# Patient Record
Sex: Male | Born: 1980 | Hispanic: Yes | Marital: Single | Smoking: Never smoker
Health system: Southern US, Community
[De-identification: ages and names within clinical notes are randomized; demographics above are authoritative.]

---

## 2013-10-04 ENCOUNTER — Emergency Department (HOSPITAL_COMMUNITY): Payer: Self-pay

## 2013-10-04 ENCOUNTER — Inpatient Hospital Stay (HOSPITAL_COMMUNITY)
Admission: EM | Admit: 2013-10-04 | Discharge: 2013-10-05 | DRG: 090 | Disposition: A | Discharge status: Home / Self Care | Payer: Self-pay | Attending: General Surgery | Admitting: General Surgery

## 2013-10-04 ENCOUNTER — Other Ambulatory Visit: Payer: Self-pay

## 2013-10-04 ENCOUNTER — Encounter (HOSPITAL_COMMUNITY): Payer: Self-pay | Admitting: Emergency Medicine

## 2013-10-04 DIAGNOSIS — S1093XA Contusion of unspecified part of neck, initial encounter: Secondary | ICD-10-CM

## 2013-10-04 DIAGNOSIS — H532 Diplopia: Secondary | ICD-10-CM | POA: Diagnosis present

## 2013-10-04 DIAGNOSIS — S060XAA Concussion with loss of consciousness status unknown, initial encounter: Principal | ICD-10-CM | POA: Diagnosis present

## 2013-10-04 DIAGNOSIS — S0083XA Contusion of other part of head, initial encounter: Secondary | ICD-10-CM | POA: Diagnosis present

## 2013-10-04 DIAGNOSIS — S0003XA Contusion of scalp, initial encounter: Secondary | ICD-10-CM

## 2013-10-04 DIAGNOSIS — S0180XA Unspecified open wound of other part of head, initial encounter: Secondary | ICD-10-CM

## 2013-10-04 DIAGNOSIS — S0990XA Unspecified injury of head, initial encounter: Secondary | ICD-10-CM

## 2013-10-04 DIAGNOSIS — S060X9A Concussion with loss of consciousness of unspecified duration, initial encounter: Principal | ICD-10-CM | POA: Diagnosis present

## 2013-10-04 DIAGNOSIS — S0181XA Laceration without foreign body of other part of head, initial encounter: Secondary | ICD-10-CM | POA: Diagnosis present

## 2013-10-04 LAB — URINALYSIS, ROUTINE W REFLEX MICROSCOPIC
BILIRUBIN URINE: NEGATIVE
GLUCOSE, UA: NEGATIVE mg/dL
KETONES UR: NEGATIVE mg/dL
Leukocytes, UA: NEGATIVE
Nitrite: NEGATIVE
Protein, ur: 100 mg/dL — AB
SPECIFIC GRAVITY, URINE: 1.027 (ref 1.005–1.030)
Urobilinogen, UA: 0.2 mg/dL (ref 0.0–1.0)
pH: 6.5 (ref 5.0–8.0)

## 2013-10-04 LAB — CBC WITH DIFFERENTIAL/PLATELET
BASOS PCT: 0 % (ref 0–1)
Basophils Absolute: 0 10*3/uL (ref 0.0–0.1)
Eosinophils Absolute: 0.2 10*3/uL (ref 0.0–0.7)
Eosinophils Relative: 2 % (ref 0–5)
HCT: 44.3 % (ref 39.0–52.0)
HEMOGLOBIN: 16.4 g/dL (ref 13.0–17.0)
Lymphocytes Relative: 26 % (ref 12–46)
Lymphs Abs: 2.8 10*3/uL (ref 0.7–4.0)
MCH: 31.3 pg (ref 26.0–34.0)
MCHC: 37 g/dL — ABNORMAL HIGH (ref 30.0–36.0)
MCV: 84.5 fL (ref 78.0–100.0)
MONOS PCT: 9 % (ref 3–12)
Monocytes Absolute: 0.9 10*3/uL (ref 0.1–1.0)
NEUTROS ABS: 7 10*3/uL (ref 1.7–7.7)
Neutrophils Relative %: 64 % (ref 43–77)
PLATELETS: 279 10*3/uL (ref 150–400)
RBC: 5.24 MIL/uL (ref 4.22–5.81)
RDW: 12.1 % (ref 11.5–15.5)
WBC: 11 10*3/uL — ABNORMAL HIGH (ref 4.0–10.5)

## 2013-10-04 LAB — POCT I-STAT TROPONIN I: Troponin i, poc: 0.01 ng/mL (ref 0.00–0.08)

## 2013-10-04 LAB — COMPREHENSIVE METABOLIC PANEL
ALK PHOS: 63 U/L (ref 39–117)
ALT: 49 U/L (ref 0–53)
AST: 56 U/L — AB (ref 0–37)
Albumin: 4.3 g/dL (ref 3.5–5.2)
BUN: 14 mg/dL (ref 6–23)
CHLORIDE: 100 meq/L (ref 96–112)
CO2: 25 mEq/L (ref 19–32)
Calcium: 9.3 mg/dL (ref 8.4–10.5)
Creatinine, Ser: 1.01 mg/dL (ref 0.50–1.35)
Glucose, Bld: 106 mg/dL — ABNORMAL HIGH (ref 70–99)
POTASSIUM: 3.7 meq/L (ref 3.7–5.3)
SODIUM: 138 meq/L (ref 137–147)
TOTAL PROTEIN: 7.5 g/dL (ref 6.0–8.3)
Total Bilirubin: 0.3 mg/dL (ref 0.3–1.2)

## 2013-10-04 LAB — URINE MICROSCOPIC-ADD ON

## 2013-10-04 LAB — RAPID URINE DRUG SCREEN, HOSP PERFORMED
AMPHETAMINES: NOT DETECTED
BARBITURATES: NOT DETECTED
Benzodiazepines: NOT DETECTED
Cocaine: NOT DETECTED
Opiates: NOT DETECTED
Tetrahydrocannabinol: NOT DETECTED

## 2013-10-04 LAB — LIPASE, BLOOD: Lipase: 33 U/L (ref 11–59)

## 2013-10-04 LAB — ETHANOL

## 2013-10-04 MED ORDER — TETANUS-DIPHTH-ACELL PERTUSSIS 5-2.5-18.5 LF-MCG/0.5 IM SUSP
0.5000 mL | Freq: Once | INTRAMUSCULAR | Status: AC
  Administered 2013-10-04: 0.5 mL via INTRAMUSCULAR
  Filled 2013-10-04: qty 0.5

## 2013-10-04 MED ORDER — ACETAMINOPHEN 650 MG RE SUPP
650.0000 mg | Freq: Once | RECTAL | Status: AC
  Administered 2013-10-04: 650 mg via RECTAL
  Filled 2013-10-04: qty 1

## 2013-10-04 NOTE — Progress Notes (Signed)
Chaplain reported to page.  Pt responding to medical team but does not speak english or have id on him.  Chaplain asked emt if family was present and they said "no".  Chaplain left message with front desk to page if follow up is needed.
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
## 2013-10-04 NOTE — ED Notes (Signed)
Dr. Janee Mornhompson in to assess pt for admission
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
## 2013-10-04 NOTE — ED Notes (Signed)
Introduced self to pt's cousin, Wallie Renshawlmer Martinez [phone]) and Stormy FabianKathryn Rowdowsky [phone]), Elmer's employer( [employer]), and explained role of CSW and offered emotional support.  Pt disoriented. 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 Ms. Philis KendallRowdowsky agreeable to be contact person for pt as his cousin speaks very little english. Cousin Jay Schlichterlmer is his only relative in the area. Pt lives with cousin in a house Ms. Rodowsky pays for. CSW will continue to follow for disposition.

## 2013-10-04 NOTE — ED Notes (Signed)
Pt resting quietly at this time with eyes closed.  Will respond to verbal stimuli then goes back to sleep.
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
## 2013-10-05 ENCOUNTER — Inpatient Hospital Stay (HOSPITAL_COMMUNITY): Payer: Self-pay

## 2013-10-05 DIAGNOSIS — S0181XA Laceration without foreign body of other part of head, initial encounter: Secondary | ICD-10-CM | POA: Diagnosis present

## 2013-10-05 DIAGNOSIS — S0083XA Contusion of other part of head, initial encounter: Secondary | ICD-10-CM | POA: Diagnosis present

## 2013-10-05 LAB — INFLUENZA PANEL BY PCR (TYPE A & B)
H1N1FLUPCR: NOT DETECTED
Influenza A By PCR: NEGATIVE
Influenza B By PCR: NEGATIVE

## 2013-10-05 LAB — BASIC METABOLIC PANEL
BUN: 13 mg/dL (ref 6–23)
CALCIUM: 9.1 mg/dL (ref 8.4–10.5)
CHLORIDE: 101 meq/L (ref 96–112)
CO2: 24 meq/L (ref 19–32)
Creatinine, Ser: 1.01 mg/dL (ref 0.50–1.35)
GFR calc Af Amer: >90 mL/min (ref >90)
GFR calc non Af Amer: >90 mL/min (ref >90)
Glucose, Bld: 106 mg/dL — ABNORMAL HIGH (ref 70–99)
Potassium: 4.1 mEq/L (ref 3.7–5.3)
Sodium: 140 mEq/L (ref 137–147)

## 2013-10-05 LAB — CBC
HEMATOCRIT: 43.5 % (ref 39.0–52.0)
Hemoglobin: 15.6 g/dL (ref 13.0–17.0)
MCH: 30.6 pg (ref 26.0–34.0)
MCHC: 35.9 g/dL (ref 30.0–36.0)
MCV: 85.5 fL (ref 78.0–100.0)
PLATELETS: 285 10*3/uL (ref 150–400)
RBC: 5.09 MIL/uL (ref 4.22–5.81)
RDW: 12.1 % (ref 11.5–15.5)
WBC: 11 10*3/uL — AB (ref 4.0–10.5)

## 2013-10-05 MED ORDER — ONDANSETRON HCL 4 MG PO TABS
4.0000 mg | ORAL_TABLET | Freq: Four times a day (QID) | ORAL | Status: DC | PRN
Start: 2013-10-05 — End: 2013-10-05

## 2013-10-05 MED ORDER — ONDANSETRON HCL 4 MG/2ML IJ SOLN: 4.0000 mg | Freq: Four times a day (QID) | INTRAMUSCULAR | Status: DC | PRN

## 2013-10-05 MED ORDER — MORPHINE SULFATE 2 MG/ML IJ SOLN: 2.0000 mg | INTRAMUSCULAR | Status: DC | PRN

## 2013-10-05 MED ORDER — OXYCODONE HCL 5 MG PO TABS: 10.0000 mg | ORAL_TABLET | ORAL | Status: DC | PRN

## 2013-10-05 MED ORDER — PANTOPRAZOLE SODIUM 40 MG IV SOLR
40.0000 mg | Freq: Every day | INTRAVENOUS | Status: DC
  Filled 2013-10-05: qty 40

## 2013-10-05 MED ORDER — KCL IN DEXTROSE-NACL 20-5-0.45 MEQ/L-%-% IV SOLN
INTRAVENOUS | Status: DC
  Administered 2013-10-05: 04:00:00 via INTRAVENOUS
  Filled 2013-10-05 (×2): qty 1000

## 2013-10-05 MED ORDER — PANTOPRAZOLE SODIUM 40 MG PO TBEC
40.0000 mg | DELAYED_RELEASE_TABLET | Freq: Every day | ORAL | Status: DC
  Administered 2013-10-05: 40 mg via ORAL
  Filled 2013-10-05: qty 1

## 2013-10-05 MED ORDER — OXYCODONE HCL 5 MG PO TABS: 5.0000 mg | ORAL_TABLET | ORAL | Status: DC | PRN

## 2013-10-05 MED ORDER — ACETAMINOPHEN 325 MG PO TABS
650.0000 mg | ORAL_TABLET | ORAL | Status: DC | PRN
  Administered 2013-10-05: 650 mg via ORAL
  Filled 2013-10-05: qty 2

## 2013-10-05 MED ORDER — ENOXAPARIN SODIUM 40 MG/0.4ML ~~LOC~~ SOLN
40.0000 mg | SUBCUTANEOUS | Status: DC
  Administered 2013-10-05: 40 mg via SUBCUTANEOUS
  Filled 2013-10-05: qty 0.4

## 2013-10-05 NOTE — Progress Notes (Signed)
Language barrier.  Vitals ok Able to reproduce movements when shown cta Reg Soft, nt, nd  Plan per PA note  I saw the patient, participated in the exam and medical decision making, and concur with the physician assistant's note above.  Cherylyn Sundby M. Andrey CampanileWilson, MD, FACS General, BMary Sellaariatric, & Minimally Invasive Surgery [facility], [place]

## 2013-10-05 NOTE — ED Notes (Signed)
Pt's employer [employer]) made aware of pt's admission [phone]
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
## 2013-10-05 NOTE — Progress Notes (Signed)
UR completed.  Taher Vannote, RN BSN MHA CCM Trauma/Neuro ICU Case Manager [phone]  

## 2013-10-05 NOTE — Discharge Instructions (Signed)
Si la visin doble dura ms de 2 semanas por favor llmenos. No se puede conducir hasta la visin doble se resuelve.
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
## 2013-10-08 NOTE — Discharge Summary (Signed)
Bresha Hosack M. Faydra Korman, MD, FACS General, Bariatric, & Minimally Invasive Surgery [facility]  
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
